# Patient Record
Sex: Female | Born: 1970 | Race: Black or African American | Hispanic: No | Marital: Single | Smoking: Never smoker
Health system: Southern US, Community
[De-identification: ages and names within clinical notes are randomized; demographics above are authoritative.]

## PROBLEM LIST (undated history)

## (undated) DIAGNOSIS — T7840XA Allergy, unspecified, initial encounter: Secondary | ICD-10-CM

## (undated) DIAGNOSIS — F4310 Post-traumatic stress disorder, unspecified: Secondary | ICD-10-CM

## (undated) DIAGNOSIS — J45909 Unspecified asthma, uncomplicated: Secondary | ICD-10-CM

## (undated) DIAGNOSIS — F419 Anxiety disorder, unspecified: Secondary | ICD-10-CM

## (undated) DIAGNOSIS — F32A Depression, unspecified: Secondary | ICD-10-CM

## (undated) HISTORY — DX: Anxiety disorder, unspecified: F41.9

## (undated) HISTORY — DX: Allergy, unspecified, initial encounter: T78.40XA

## (undated) HISTORY — DX: Unspecified asthma, uncomplicated: J45.909

---

## 2001-12-26 ENCOUNTER — Emergency Department (HOSPITAL_COMMUNITY): Admission: EM | Admit: 2001-12-26 | Discharge: 2001-12-26 | Payer: Self-pay | Admitting: Emergency Medicine

## 2001-12-26 ENCOUNTER — Encounter: Payer: Self-pay | Admitting: Emergency Medicine

## 2004-04-03 ENCOUNTER — Other Ambulatory Visit: Admission: RE | Admit: 2004-04-03 | Discharge: 2004-04-03 | Payer: Self-pay | Admitting: Obstetrics and Gynecology

## 2005-05-21 ENCOUNTER — Other Ambulatory Visit: Admission: RE | Admit: 2005-05-21 | Discharge: 2005-05-21 | Payer: Self-pay | Admitting: Obstetrics and Gynecology

## 2005-07-18 ENCOUNTER — Emergency Department (HOSPITAL_COMMUNITY): Admission: EM | Admit: 2005-07-18 | Discharge: 2005-07-19 | Payer: Self-pay | Admitting: Emergency Medicine

## 2005-07-19 ENCOUNTER — Encounter: Admission: RE | Admit: 2005-07-19 | Discharge: 2005-08-22 | Payer: Self-pay | Admitting: Family Medicine

## 2007-03-14 ENCOUNTER — Inpatient Hospital Stay (HOSPITAL_COMMUNITY): Admission: AD | Admit: 2007-03-14 | Discharge: 2007-03-14 | Payer: Self-pay | Admitting: Obstetrics and Gynecology

## 2007-05-28 ENCOUNTER — Inpatient Hospital Stay (HOSPITAL_COMMUNITY): Admission: AD | Admit: 2007-05-28 | Discharge: 2007-05-29 | Payer: Self-pay | Admitting: Pediatrics

## 2007-06-01 ENCOUNTER — Inpatient Hospital Stay (HOSPITAL_COMMUNITY): Admission: AD | Admit: 2007-06-01 | Discharge: 2007-06-04 | Payer: Self-pay | Admitting: Obstetrics and Gynecology

## 2007-06-11 ENCOUNTER — Inpatient Hospital Stay (HOSPITAL_COMMUNITY): Admission: AD | Admit: 2007-06-11 | Discharge: 2007-06-11 | Payer: Self-pay | Admitting: Obstetrics and Gynecology

## 2008-01-25 ENCOUNTER — Emergency Department (HOSPITAL_COMMUNITY): Admission: EM | Admit: 2008-01-25 | Discharge: 2008-01-26 | Payer: Self-pay | Admitting: Emergency Medicine
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
## 2011-05-30 LAB — POCT I-STAT, CHEM 8
BUN: 15
Calcium, Ion: 1.07 — ABNORMAL LOW
Chloride: 105
Creatinine, Ser: 1.1
Glucose, Bld: 92
HCT: 42
Hemoglobin: 14.3
Potassium: 4
Sodium: 138
TCO2: 25

## 2011-05-30 LAB — DIFFERENTIAL
Basophils Absolute: 0
Basophils Relative: 0
Eosinophils Absolute: 0
Eosinophils Relative: 0
Lymphocytes Relative: 6 — ABNORMAL LOW
Lymphs Abs: 0.5 — ABNORMAL LOW
Monocytes Absolute: 0.2
Monocytes Relative: 2 — ABNORMAL LOW
Neutro Abs: 8.6 — ABNORMAL HIGH
Neutrophils Relative %: 92 — ABNORMAL HIGH

## 2011-05-30 LAB — CBC
HCT: 40.6
Hemoglobin: 13.7
MCHC: 33.7
MCV: 84.4
Platelets: 199
RBC: 4.81
RDW: 13.9
WBC: 9.3

## 2011-05-30 LAB — CLOSTRIDIUM DIFFICILE EIA: C difficile Toxins A+B, EIA: NEGATIVE

## 2011-06-14 LAB — URINALYSIS, ROUTINE W REFLEX MICROSCOPIC
Bilirubin Urine: NEGATIVE
Hgb urine dipstick: NEGATIVE
Nitrite: NEGATIVE
Specific Gravity, Urine: 1.025
Urobilinogen, UA: 0.2
pH: 6

## 2011-06-14 LAB — COMPREHENSIVE METABOLIC PANEL
ALT: 24
Albumin: 3.3 — ABNORMAL LOW
Alkaline Phosphatase: 106
Calcium: 9
GFR calc Af Amer: >60
Glucose, Bld: 89
Potassium: 3.9
Sodium: 135
Total Protein: 7.9

## 2011-06-14 LAB — CBC
HCT: 26.9 — ABNORMAL LOW
Hemoglobin: 12.2
Hemoglobin: 9.1 — ABNORMAL LOW
MCHC: 33.5
MCHC: 34.4
MCV: 87.7
Platelets: 297
RBC: 4.12
RDW: 14.3 — ABNORMAL HIGH
WBC: 10.5
WBC: 10.7 — ABNORMAL HIGH

## 2011-06-14 LAB — LACTATE DEHYDROGENASE: LDH: 141

## 2011-06-14 LAB — RPR: RPR Ser Ql: NONREACTIVE

## 2011-06-19 LAB — RH IMMUNE GLOBULIN WORKUP (NOT WOMEN'S HOSP)

## 2013-10-07 ENCOUNTER — Ambulatory Visit (INDEPENDENT_AMBULATORY_CARE_PROVIDER_SITE_OTHER): Payer: BC Managed Care – PPO | Admitting: Physician Assistant

## 2013-10-07 VITALS — BP 140/90 | HR 116 | Temp 101.9°F | Resp 18 | Ht 69.0 in | Wt 340.0 lb

## 2013-10-07 DIAGNOSIS — R509 Fever, unspecified: Secondary | ICD-10-CM

## 2013-10-07 DIAGNOSIS — D72829 Elevated white blood cell count, unspecified: Secondary | ICD-10-CM

## 2013-10-07 DIAGNOSIS — J029 Acute pharyngitis, unspecified: Secondary | ICD-10-CM

## 2013-10-07 LAB — POCT CBC
Granulocyte percent: 83.5 %G — AB (ref 37–80)
HEMATOCRIT: 40.9 % (ref 37.7–47.9)
HEMOGLOBIN: 12.6 g/dL (ref 12.2–16.2)
LYMPH, POC: 1.1 (ref 0.6–3.4)
MCH: 28 pg (ref 27–31.2)
MCHC: 30.8 g/dL — AB (ref 31.8–35.4)
MCV: 90.9 fL (ref 80–97)
MID (cbc): 0.7 (ref 0–0.9)
MPV: 8.9 fL (ref 0–99.8)
POC Granulocyte: 8.9 — AB (ref 2–6.9)
POC LYMPH PERCENT: 10 %L (ref 10–50)
POC MID %: 6.5 %M (ref 0–12)
Platelet Count, POC: 175 10*3/uL (ref 142–424)
RBC: 4.5 M/uL (ref 4.04–5.48)
RDW, POC: 13.2 %
WBC: 10.6 10*3/uL — AB (ref 4.6–10.2)

## 2013-10-07 LAB — POCT INFLUENZA A/B
Influenza A, POC: NEGATIVE
Influenza B, POC: NEGATIVE

## 2013-10-07 LAB — POCT RAPID STREP A (OFFICE): RAPID STREP A SCREEN: NEGATIVE

## 2013-10-07 MED ORDER — AMOXICILLIN 875 MG PO TABS: 875.0000 mg | ORAL_TABLET | Freq: Two times a day (BID) | ORAL | Status: DC

## 2013-10-07 NOTE — Progress Notes (Signed)
Subjective:    Patient ID: Laurie DoeErika Norkus, female    DOB: 17-Aug-1971, 43 y.o.   MRN: [id number]  HPI   Ms. Thad RangerReynolds is a very pleasant 43 yr old female here with concern for illness.  Abrupt onset of symptoms last night.  Symptoms include sore throat, laryngitis, sinus pressure, ear fullness, body aches, fever to 103.76F.  Denies cough, SOB, wheezing.  Endorses nasal and post-nasal drainage.  +sick contacts at work.  No flu shot this season.  Using 1-2 tylenol or advil prn.   Review of Systems  Constitutional: Positive for fever.  HENT: Positive for congestion, ear pain, postnasal drip and rhinorrhea.   Respiratory: Negative for cough, shortness of breath and wheezing.   Cardiovascular: Negative.   Gastrointestinal: Negative.   Musculoskeletal: Positive for arthralgias and myalgias.  Skin: Negative.        Objective:   Physical Exam  Vitals reviewed. Constitutional: She is oriented to person, place, and time. She appears well-developed and well-nourished. No distress.  HENT:  Head: Normocephalic and atraumatic.  Right Ear: Tympanic membrane and ear canal normal.  Left Ear: Tympanic membrane and ear canal normal.  Mouth/Throat: Uvula is midline and mucous membranes are normal. Posterior oropharyngeal erythema present. No oropharyngeal exudate, posterior oropharyngeal edema or tonsillar abscesses.  Eyes: Conjunctivae are normal. No scleral icterus.  Neck: Neck supple.  Cardiovascular: Regular rhythm and normal heart sounds.  Tachycardia present.   Pulmonary/Chest: Effort normal and breath sounds normal. She has no wheezes. She has no rales.  Lymphadenopathy:    She has no cervical adenopathy.  Neurological: She is alert and oriented to person, place, and time.  Skin: Skin is warm and dry.  Psychiatric: She has a normal mood and affect. Her behavior is normal.    Results for orders placed in visit on 10/07/13  POCT INFLUENZA A/B      Result Value Range   Influenza A, POC  Negative     Influenza B, POC Negative    POCT RAPID STREP A (OFFICE)      Result Value Range   Rapid Strep A Screen Negative  Negative  POCT CBC      Result Value Range   WBC 10.6 (*) 4.6 - 10.2 K/uL   Lymph, poc 1.1  0.6 - 3.4   POC LYMPH PERCENT 10.0  10 - 50 %L   MID (cbc) 0.7  0 - 0.9   POC MID % 6.5  0 - 12 %M   POC Granulocyte 8.9 (*) 2 - 6.9   Granulocyte percent 83.5 (*) 37 - 80 %G   RBC 4.50  4.04 - 5.48 M/uL   Hemoglobin 12.6  12.2 - 16.2 g/dL   HCT, POC 40.940.9  81.137.7 - 47.9 %   MCV 90.9  80 - 97 fL   MCH, POC 28.0  27 - 31.2 pg   MCHC 30.8 (*) 31.8 - 35.4 g/dL   RDW, POC 91.413.2     Platelet Count, POC 175  142 - 424 K/uL   MPV 8.9  0 - 99.8 fL        Assessment & Plan:  Fever, unspecified - Plan: POCT Influenza A/B, POCT rapid strep A, Culture, Group A Strep, POCT CBC, amoxicillin (AMOXIL) 875 MG tablet  Sore throat - Plan: POCT Influenza A/B, POCT rapid strep A, Culture, Group A Strep, POCT CBC, amoxicillin (AMOXIL) 875 MG tablet  Leukocytosis, unspecified - Plan: amoxicillin (AMOXIL) 875 MG tablet   Ms. Thad RangerReynolds is a  very pleasant 43 yr old female with abrupt onset of fever, myalgia, ST last night.  Rapid flu and strep tests both negative.  Throat cx pending.  CBC shows leukocytosis with shift suggesting bacterial etiology of symptoms.  Will start amox to cover strep and upper resp pathogens.  Continue Tylenol, Advil for fever, pain.  Mucinex for congestion.   Push fluids, rest.  OOW until fever free for 24 hours without medication.  Pt to call or RTC if worsening or not improving.   Loleta Dicker MHS, PA-C [facility] 2/4/20159:37 AM

## 2013-10-07 NOTE — Patient Instructions (Signed)
Begin taking the amoxicillin today.  This will cover you for upper respiratory infection including strep throat.  Finish the full course of medication even when you begin feeling better  Alternate Tylenol and Advil to keep the fever down, help with body aches and sore throat  Drink plenty of fluids (water is best!) and get plenty of rest  Out of work until fever free for 24 hours without Tylenol or Advil  If any symptoms are worsening or not improving please let us know

## 2013-10-09 LAB — CULTURE, GROUP A STREP: ORGANISM ID, BACTERIA: NORMAL

## 2014-02-23 ENCOUNTER — Ambulatory Visit (INDEPENDENT_AMBULATORY_CARE_PROVIDER_SITE_OTHER): Payer: BC Managed Care – PPO | Admitting: Family Medicine

## 2014-02-23 VITALS — BP 160/100 | HR 90 | Temp 98.8°F | Resp 20 | Ht 68.0 in | Wt 339.4 lb

## 2014-02-23 DIAGNOSIS — H109 Unspecified conjunctivitis: Secondary | ICD-10-CM

## 2014-02-23 DIAGNOSIS — J069 Acute upper respiratory infection, unspecified: Secondary | ICD-10-CM

## 2014-02-23 MED ORDER — TOBRAMYCIN 0.3 % OP SOLN: 1.0000 [drp] | Freq: Four times a day (QID) | OPHTHALMIC | Status: DC

## 2014-02-23 MED ORDER — AZITHROMYCIN 250 MG PO TABS
ORAL_TABLET | ORAL | Status: DC
Start: 2014-02-23 — End: 2016-10-04

## 2014-02-23 NOTE — Progress Notes (Signed)
° °  Subjective:    Patient ID: Laurie Ellison, female    DOB: 06-May-1971, 43 y.o.   MRN: [id number] This chart was scribed for Elvina SidleKurt Lauenstein, MD by Evon Slackerrance Branch, ED Scribe. This Patient was seen in room 03 and the patients care was started at 8:17 PM  HPI HPI Comments: Laurie Doerika Talavera is a 43 y.o. female who presents to the [facility] complaining of eye redness onset 2 days prior. She states she has some associated eye discharge, sore throat, and ear pain. She states her symptoms started after going [place] community pool. She also presents with high blood pressure. She states that its normally not this high. She denies taking any medications other than Zyrtec for her seasonal allergies. 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      Review of Systems  HENT: Positive for ear pain and sore throat.   Eyes: Positive for discharge and redness.     Objective:    Physical Exam Obese woman in no acute distress, seen with daughter HEENT: Normal color TMs, hoarse, right eye injected diffusely with normal fundus, oropharynx mildly erythematous on the left side Neck: Supple no adenopathy, no pretragal nodes Chest: Clear Heart: Regular no murmur Skin: No rash   Assessment & Plan:  Conjunctivitis of right eye - Plan: tobramycin (TOBREX) 0.3 % ophthalmic solution  URI (upper respiratory infection) - Plan: azithromycin (ZITHROMAX Z-PAK) 250 MG tablet  Signed, Elvina SidleKurt Lauenstein, MD

## 2014-02-23 NOTE — Patient Instructions (Signed)

## 2015-11-28 ENCOUNTER — Encounter (HOSPITAL_COMMUNITY): Payer: Self-pay | Admitting: Emergency Medicine

## 2015-11-28 ENCOUNTER — Emergency Department (HOSPITAL_COMMUNITY)
Admission: EM | Admit: 2015-11-28 | Discharge: 2015-11-28 | Disposition: A | Discharge status: Home / Self Care | Payer: BC Managed Care – PPO | Attending: Emergency Medicine | Admitting: Emergency Medicine

## 2015-11-28 ENCOUNTER — Emergency Department (HOSPITAL_COMMUNITY): Payer: BC Managed Care – PPO

## 2015-11-28 DIAGNOSIS — H748X3 Other specified disorders of middle ear and mastoid, bilateral: Secondary | ICD-10-CM | POA: Diagnosis not present

## 2015-11-28 DIAGNOSIS — Z792 Long term (current) use of antibiotics: Secondary | ICD-10-CM | POA: Insufficient documentation

## 2015-11-28 DIAGNOSIS — J209 Acute bronchitis, unspecified: Secondary | ICD-10-CM

## 2015-11-28 DIAGNOSIS — J45901 Unspecified asthma with (acute) exacerbation: Secondary | ICD-10-CM | POA: Insufficient documentation

## 2015-11-28 DIAGNOSIS — R509 Fever, unspecified: Secondary | ICD-10-CM | POA: Diagnosis present

## 2015-11-28 MED ORDER — FLUTICASONE PROPIONATE 50 MCG/ACT NA SUSP: 2.0000 | Freq: Every day | NASAL | Status: DC

## 2015-11-28 MED ORDER — BENZONATATE 100 MG PO CAPS: 100.0000 mg | ORAL_CAPSULE | Freq: Three times a day (TID) | ORAL | Status: DC | PRN

## 2015-11-28 MED ORDER — CETIRIZINE HCL 10 MG PO TABS: 10.0000 mg | ORAL_TABLET | Freq: Every day | ORAL | Status: AC

## 2015-11-28 MED ORDER — NAPROXEN 250 MG PO TABS: 250.0000 mg | ORAL_TABLET | Freq: Two times a day (BID) | ORAL | Status: DC

## 2015-11-28 MED ORDER — ALBUTEROL SULFATE HFA 108 (90 BASE) MCG/ACT IN AERS
2.0000 | INHALATION_SPRAY | Freq: Once | RESPIRATORY_TRACT | Status: AC
  Administered 2015-11-28: 2 via RESPIRATORY_TRACT
  Filled 2015-11-28: qty 6.7

## 2015-11-28 NOTE — ED Notes (Signed)
Pt sts fever and body aches with cough and nasal congestion x 3 days; pt sts worse today and noted hypertension at [facility] so sent here for eval

## 2015-11-28 NOTE — ED Provider Notes (Signed)
CSN: [id number]     Arrival date & time 11/28/15  1131 History  By signing my name below, I, Tanda Rockers, attest that this documentation has been prepared under the direction and in the presence of Everlene Farrier, PA-C. Electronically Signed: Tanda Rockers, ED Scribe. 11/28/2015. 1:10 PM.   Chief Complaint  Patient presents with  . Fever  . Cough   The history is provided by the patient. No language interpreter was used.     HPI Comments: Laurie Ellison is a 45 y.o. female with PMHx asthma who presents to the Emergency Department complaining of gradual onset, constant, cough and wheezing x 3 days. Pt also complains of congestion, rhinorrhea, post nasal drip, fever, and body aches. Pt did take 3 Tylenol earlier today for her symptoms with some relief. She did not receive her flu vaccine this year. She was seen at [facility] today for her symptoms. She was tested for the flu which was negative but was sent here for hypertension. Her BP in the ED is 153/104. Pt does not have a hx of HTN. Denies shortness of breath, nausea, vomiting, diarrhea, ear pain, or any other associated symptoms.    PCP - Dr. Wynelle Link  Past Medical History  Diagnosis Date  . Allergy   . Asthma    History reviewed. No pertinent past surgical history. Family History  Problem Relation Age of Onset  . Hypertension Mother   . Hypertension Father    Social History  Substance Use Topics  . Smoking status: Never Smoker   . Smokeless tobacco: Never Used  . Alcohol Use: No   OB History    No data available     Review of Systems  Constitutional: Positive for fever.  HENT: Positive for congestion, postnasal drip and rhinorrhea. Negative for sore throat.   Eyes: Negative for visual disturbance.  Respiratory: Positive for cough and wheezing. Negative for chest tightness and shortness of breath.   Cardiovascular: Negative for chest pain and palpitations.  Gastrointestinal: Negative for nausea, vomiting,  abdominal pain and diarrhea.  Genitourinary: Negative for dysuria.  Musculoskeletal: Positive for myalgias. Negative for back pain and neck pain.  Skin: Negative for rash.  Neurological: Negative for syncope, light-headedness and headaches.      Allergies  Review of patient's allergies indicates no known allergies.  Home Medications   Prior to Admission medications   Medication Sig Start Date End Date Taking? Authorizing Provider  azithromycin (ZITHROMAX Z-PAK) 250 MG tablet Take as directed on pack 02/23/14   Elvina Sidle, MD  benzonatate (TESSALON) 100 MG capsule Take 1 capsule (100 mg total) by mouth 3 (three) times daily as needed for cough. 11/28/15   Everlene Farrier, PA-C  cetirizine (ZYRTEC ALLERGY) 10 MG tablet Take 1 tablet (10 mg total) by mouth daily. 11/28/15   Everlene Farrier, PA-C  fluticasone (FLONASE) 50 MCG/ACT nasal spray Place 2 sprays into both nostrils daily. 11/28/15   Everlene Farrier, PA-C  naproxen (NAPROSYN) 250 MG tablet Take 1 tablet (250 mg total) by mouth 2 (two) times daily with a meal. 11/28/15   Everlene Farrier, PA-C  tobramycin (TOBREX) 0.3 % ophthalmic solution Place 1 drop into the right eye every 6 (six) hours. 02/23/14   Elvina Sidle, MD   BP 153/104 mmHg  Pulse 99  Temp(Src) 100.3 F (37.9 C) (Oral)  Resp 18  SpO2 96%  LMP 11/27/2015   Physical Exam  Constitutional: She is oriented to person, place, and time. She appears well-developed and well-nourished.  No distress.  Nontoxic appearing.  HENT:  Head: Normocephalic and atraumatic.  Right Ear: External ear normal. A middle ear effusion is present.  Left Ear: External ear normal. A middle ear effusion is present.  No TM erythema or loss of landmarks bilaterally.   Eyes: Conjunctivae are normal. Pupils are equal, round, and reactive to light. Right eye exhibits no discharge. Left eye exhibits no discharge.  Neck: Normal range of motion. Neck supple.  Cardiovascular: Normal rate, regular rhythm,  normal heart sounds and intact distal pulses.   Heart rate is 76 bpm.   Pulmonary/Chest: Effort normal. No respiratory distress. She has wheezes. She has no rhonchi. She has no rales.  Slight scattered wheeze to bilateral bases. No increased work of breathing.  No rales or rhonchi.   Abdominal: Soft. There is no tenderness. There is no guarding.  Musculoskeletal: She exhibits no edema.  Lymphadenopathy:    She has no cervical adenopathy.  Neurological: She is alert and oriented to person, place, and time. No cranial nerve deficit. Coordination normal.  Skin: Skin is warm and dry. No rash noted. She is not diaphoretic. No erythema. No pallor.  Psychiatric: She has a normal mood and affect. Her behavior is normal.  Nursing note and vitals reviewed.   ED Course  Procedures (including critical care time)  DIAGNOSTIC STUDIES: Oxygen Saturation is 96% on RA, normal by my interpretation.    COORDINATION OF CARE: 1:07 PM-Discussed treatment plan which includes Rx tessalon pearls, nasal steroids, and Naprosyn with pt at bedside and pt agreed to plan.   Labs Review Labs Reviewed - No data to display  Imaging Review Dg Chest 2 View  11/28/2015  CLINICAL DATA:  Productive cough and mid chest pain.  Fever. EXAM: CHEST  2 VIEW COMPARISON:  None. FINDINGS: There is slight peribronchial thickening. The lungs are otherwise clear. Heart size and vascularity are normal. No effusions. Bones are normal. IMPRESSION: Slight bronchitic changes. Electronically Signed   By: Francene BoyersJames  Maxwell M.D.   On: 11/28/2015 12:47   I have personally reviewed and evaluated these images as part of my medical decision-making.   MDM   Meds given in ED:  Medications  albuterol (PROVENTIL HFA;VENTOLIN HFA) 108 (90 Base) MCG/ACT inhaler 2 puff (not administered)    New Prescriptions   BENZONATATE (TESSALON) 100 MG CAPSULE    Take 1 capsule (100 mg total) by mouth 3 (three) times daily as needed for cough.   CETIRIZINE  (ZYRTEC ALLERGY) 10 MG TABLET    Take 1 tablet (10 mg total) by mouth daily.   FLUTICASONE (FLONASE) 50 MCG/ACT NASAL SPRAY    Place 2 sprays into both nostrils daily.   NAPROXEN (NAPROSYN) 250 MG TABLET    Take 1 tablet (250 mg total) by mouth 2 (two) times daily with a meal.    Final diagnoses:  Acute bronchitis, unspecified organism   This is a 45 y.o. female with PMHx asthma who presents to the Emergency Department complaining of gradual onset, constant, cough and wheezing x 3 days. Pt also complains of congestion, rhinorrhea, post nasal drip, fever, and body aches. Pt did take 3 Tylenol earlier today for her symptoms with some relief. She did not receive her flu vaccine this year. [facility] her [facility]. She was tested for the flu which was negative but was sent here for hypertension. Her BP in the ED is 153/104. Pt does not have a hx of HTN. Denies shortness  of breath.  .On exam the patient has a temperature of 100.3. She is nontoxic-appearing. She has slight scattered wheezes noted bilaterally. No increased work of breathing. Blood pressure is 153/104. She denies headache. Chest x-ray shows slight bronchitic changes. Patient with acute bronchitis. Will provide with albuterol inhaler in the emergency department and discharged with prescriptions for naproxen, Flonase, Zyrtec and Tessalon Perles. I encouraged her to follow closely with her primary care provider to have her blood pressure rechecked and possibly be started on blood pressure medications. I advised the patient to follow-up with their primary care provider this week. I advised the patient to return to the emergency department with new or worsening symptoms or new concerns. The patient verbalized understanding and agreement with plan.    I personally performed the services described in this documentation, which was scribed in my presence. The recorded information has been reviewed and is accurate.         Everlene Farrier, PA-C 11/28/15 1321  Arby Barrette, MD 11/30/15 1029

## 2015-11-28 NOTE — ED Notes (Signed)
Declined W/C at D/C and was escorted to lobby by RN. 

## 2015-11-28 NOTE — Discharge Instructions (Signed)

## 2016-03-09 ENCOUNTER — Other Ambulatory Visit: Payer: Self-pay | Admitting: Family Medicine

## 2016-03-09 ENCOUNTER — Other Ambulatory Visit (HOSPITAL_COMMUNITY)
Admission: RE | Admit: 2016-03-09 | Discharge: 2016-03-09 | Disposition: A | Discharge status: Home / Self Care | Payer: BC Managed Care – PPO | Source: Ambulatory Visit | Attending: Family Medicine | Admitting: Family Medicine

## 2016-03-09 DIAGNOSIS — Z113 Encounter for screening for infections with a predominantly sexual mode of transmission: Secondary | ICD-10-CM | POA: Insufficient documentation

## 2016-03-09 DIAGNOSIS — Z01419 Encounter for gynecological examination (general) (routine) without abnormal findings: Secondary | ICD-10-CM | POA: Insufficient documentation

## 2016-03-12 LAB — CYTOLOGY - PAP

## 2016-10-04 ENCOUNTER — Ambulatory Visit (INDEPENDENT_AMBULATORY_CARE_PROVIDER_SITE_OTHER): Payer: BC Managed Care – PPO | Admitting: Emergency Medicine

## 2016-10-04 VITALS — BP 164/86 | HR 70 | Temp 98.5°F | Resp 20 | Ht 69.0 in | Wt 347.0 lb

## 2016-10-04 DIAGNOSIS — R05 Cough: Secondary | ICD-10-CM | POA: Diagnosis not present

## 2016-10-04 DIAGNOSIS — J4520 Mild intermittent asthma, uncomplicated: Secondary | ICD-10-CM | POA: Diagnosis not present

## 2016-10-04 DIAGNOSIS — R059 Cough, unspecified: Secondary | ICD-10-CM

## 2016-10-04 DIAGNOSIS — J209 Acute bronchitis, unspecified: Secondary | ICD-10-CM

## 2016-10-04 MED ORDER — BENZONATATE 200 MG PO CAPS: 200.0000 mg | ORAL_CAPSULE | Freq: Two times a day (BID) | ORAL | 0 refills | Status: DC | PRN

## 2016-10-04 MED ORDER — PROMETHAZINE-CODEINE 6.25-10 MG/5ML PO SYRP: 5.0000 mL | ORAL_SOLUTION | Freq: Every evening | ORAL | 0 refills | Status: AC | PRN

## 2016-10-04 MED ORDER — AMOXICILLIN-POT CLAVULANATE 875-125 MG PO TABS: 1.0000 | ORAL_TABLET | Freq: Two times a day (BID) | ORAL | 0 refills | Status: DC

## 2016-10-04 MED ORDER — PREDNISONE 20 MG PO TABS: 40.0000 mg | ORAL_TABLET | Freq: Every day | ORAL | 0 refills | Status: AC

## 2016-10-04 NOTE — Patient Instructions (Addendum)
     IF you received an x-ray today, you will receive an invoice from [facility]. Please contact [facility] at [phone] with questions or concerns regarding your invoice.   IF you received labwork today, you will receive an invoice from [facility]. Please contact [facility] at [phone] with questions or concerns regarding your invoice.   Our billing staff will not be able to assist you with questions regarding bills from these companies.  You will be contacted with the lab results as soon as they are available. The fastest way to get your results is to activate your My Chart account. Instructions are located on the last page of this paperwork. If you have not heard from us regarding the results in 2 weeks, please contact this office.      Acute Bronchitis, Adult Acute bronchitis is when air tubes (bronchi) in the lungs suddenly get swollen. The condition can make it hard to breathe. It can also cause these symptoms:  A cough.  Coughing up clear, yellow, or green mucus.  Wheezing.  Chest congestion.  Shortness of breath.  A fever.  Body aches.  Chills.  A sore throat.  Follow these instructions at home: Medicines  Take over-the-counter and prescription medicines only as told by your doctor.  If you were prescribed an antibiotic medicine, take it as told by your doctor. Do not stop taking the antibiotic even if you start to feel better. General instructions  Rest.  Drink enough fluids to keep your pee (urine) clear or pale yellow.  Avoid smoking and secondhand smoke. If you smoke and you need help quitting, ask your doctor. Quitting will help your lungs heal faster.  Use an inhaler, cool mist vaporizer, or humidifier as told by your doctor.  Keep all follow-up visits as told by your doctor. This is important. How is this prevented? To lower your risk of getting this condition again:  Wash your hands often with soap and water. If you cannot  use soap and water, use hand sanitizer.  Avoid contact with people who have cold symptoms.  Try not to touch your hands to your mouth, nose, or eyes.  Make sure to get the flu shot every year.  Contact a doctor if:  Your symptoms do not get better in 2 weeks. Get help right away if:  You cough up blood.  You have chest pain.  You have very bad shortness of breath.  You become dehydrated.  You faint (pass out) or keep feeling like you are going to pass out.  You keep throwing up (vomiting).  You have a very bad headache.  Your fever or chills gets worse. This information is not intended to replace advice given to you by your health care provider. Make sure you discuss any questions you have with your health care provider. Document Released: 02/06/2008 Document Revised: 03/28/2016 Document Reviewed: 02/08/2016 Elsevier Interactive Patient Education  2017 [employer]  

## 2016-10-04 NOTE — Progress Notes (Signed)
Laurie Ellison 47 y.o.   Chief Complaint  Patient presents with  . Cough    Productive, Chest congestion  . Nasal Congestion  . Sinusitis  . Sore Throat    HISTORY OF PRESENT ILLNESS: This is a 46 y.o. female complaining of productive cough x almost a week. Co-workers also sick.  Cough  This is a new problem. The current episode started in the past 7 days. The problem has been gradually worsening. The problem occurs constantly. The cough is productive of sputum. Associated symptoms include chills, headaches, myalgias, nasal congestion, rhinorrhea, a sore throat and wheezing. Pertinent negatives include no chest pain, ear pain, Laurie redness, fever, hemoptysis, rash or shortness of breath. She has tried OTC cough suppressant for the symptoms. Her past medical history is significant for asthma.     Prior to Admission medications   Medication Sig Start Date End Date Taking? Authorizing Provider  albuterol (PROVENTIL HFA;VENTOLIN HFA) 108 (90 Base) MCG/ACT inhaler Inhale into the lungs every 6 (six) hours as needed for wheezing or shortness of breath.   Yes Historical Provider, MD  cetirizine (ZYRTEC ALLERGY) 10 MG tablet Take 1 tablet (10 mg total) by mouth daily. 11/28/15  Yes Everlene Farrier, PA-C    Allergies  Allergen Reactions  . Aspirin     There are no active problems to display for this patient.   Past Medical History:  Diagnosis Date  . Allergy   . Anxiety   . Asthma     No past surgical history on file.  Social History   Social History  . Marital status: Single    Spouse name: N/A  . Number of children: N/A  . Years of education: N/A   Occupational History  . Not on file.   Social History Main Topics  . Smoking status: Never Smoker  . Smokeless tobacco: Never Used  . Alcohol use No  . Drug use: No  . Sexual activity: Not on file   Other Topics Concern  . Not on file   Social History Narrative  . No narrative on file    Family History  Problem  Relation Age of Onset  . Hypertension Mother   . Hypertension Father   . Hyperlipidemia Paternal Uncle   . Heart disease Paternal Uncle   . Stroke Paternal Uncle      Review of Systems  Constitutional: Positive for chills and malaise/fatigue. Negative for fever.  HENT: Positive for congestion, rhinorrhea, sinus pain and sore throat. Negative for ear pain and nosebleeds.   Eyes: Negative for discharge and redness.  Respiratory: Positive for cough, sputum production and wheezing. Negative for hemoptysis and shortness of breath.   Cardiovascular: Negative for chest pain, palpitations and leg swelling.  Gastrointestinal: Positive for nausea. Negative for abdominal pain, diarrhea and vomiting.  Genitourinary: Negative for dysuria and hematuria.  Musculoskeletal: Positive for myalgias.  Skin: Negative for rash.  Neurological: Positive for headaches. Negative for dizziness, sensory change, speech change and focal weakness.  Endo/Heme/Allergies: Does not bruise/bleed easily.  All other systems reviewed and are negative.  Vitals:   10/04/16 1530  BP: (!) 164/86  Pulse: 70  Resp: 20  Temp: 98.5 F (36.9 C)     Physical Exam  Constitutional: She is oriented to person, place, and time. She appears well-developed.  Obese.  HENT:  Head: Normocephalic and atraumatic.  Nose: Nose normal.  Mouth/Throat: Oropharynx is clear and moist.  Eyes: Conjunctivae and EOM are normal. Pupils are equal, round, and reactive  to light.  Neck: Normal range of motion. Neck supple. No JVD present. No thyromegaly present.  Cardiovascular: Normal rate, regular rhythm and normal heart sounds.   No murmur heard. Pulmonary/Chest: Effort normal. She has wheezes (mild, scant, diffuse). She has no rales.  Abdominal: Soft. She exhibits no distension. There is no tenderness.  Musculoskeletal: Normal range of motion.  Lymphadenopathy:    She has no cervical adenopathy.  Neurological: She is alert and oriented to  person, place, and time. No sensory deficit. She exhibits normal muscle tone.  Skin: Skin is warm and dry. Capillary refill takes less than 2 seconds.  Psychiatric: She has a normal mood and affect. Her behavior is normal.  Vitals reviewed.    ASSESSMENT & PLAN: Laurie Ellison was seen today for cough, nasal congestion, sinusitis and sore throat.  Diagnoses and all orders for this visit:  Acute bronchitis, unspecified organism  Cough  Mild intermittent asthma without complication  Other orders -     amoxicillin-clavulanate (AUGMENTIN) 875-125 MG tablet; Take 1 tablet by mouth 2 (two) times daily. -     predniSONE (DELTASONE) 20 MG tablet; Take 2 tablets (40 mg total) by mouth daily with breakfast. -     promethazine-codeine (PHENERGAN WITH CODEINE) 6.25-10 MG/5ML syrup; Take 5 mLs by mouth at bedtime as needed for cough. -     benzonatate (TESSALON) 200 MG capsule; Take 1 capsule (200 mg total) by mouth 2 (two) times daily as needed for cough.    Patient Instructions       IF you received an x-ray today, you will receive an invoice from [facility]. Please contact [facility] at [phone] with questions or concerns regarding your invoice.   IF you received labwork today, you will receive an invoice from Laurie Ellison. Please contact [facility]ie Ellison at [phone] with questions or concerns regarding your invoice.   Our billing staff will not be able to assist you with questions regarding bills from these companies.  You will be contacted with the lab results as soon as they are available. The fastest way to get your results is to activate your My Chart account. Instructions are located on the last page of this paperwork. If you have not heard from us regarding the results in 2 weeks, please contact this office.     Acute Bronchitis, Adult Acute bronchitis is when air tubes (bronchi) in the lungs suddenly get swollen. The condition can make it hard to breathe. It can also  cause these symptoms:  A cough.  Coughing up clear, yellow, or green mucus.  Wheezing.  Chest congestion.  Shortness of breath.  A fever.  Body aches.  Chills.  A sore throat. Follow these instructions at home: Medicines  Take over-the-counter and prescription medicines only as told by your doctor.  If you were prescribed an antibiotic medicine, take it as told by your doctor. Do not stop taking the antibiotic even if you start to feel better. General instructions  Rest.  Drink enough fluids to keep your pee (urine) clear or pale yellow.  Avoid smoking and secondhand smoke. If you smoke and you need help quitting, ask your doctor. Quitting will help your lungs heal faster.  Use an inhaler, cool mist vaporizer, or humidifier as told by your doctor.  Keep all follow-up visits as told by your doctor. This is important. How is this prevented? To lower your risk of getting this condition again:  Wash your hands often with soap and water. If you cannot use soap  and water, use hand sanitizer.  Avoid contact with people who have cold symptoms.  Try not to touch your hands to your mouth, nose, or eyes.  Make sure to get the flu shot every year. Contact a doctor if:  Your symptoms do not get better in 2 weeks. Get help right away if:  You cough up blood.  You have chest pain.  You have very bad shortness of breath.  You become dehydrated.  You faint (pass out) or keep feeling like you are going to pass out.  You keep throwing up (vomiting).  You have a very bad headache.  Your fever or chills gets worse. This information is not intended to replace advice given to you by your health care provider. Make sure you discuss any questions you have with your health care provider. Document Released: 02/06/2008 Document Revised: 03/28/2016 Document Reviewed: 02/08/2016 Elsevier Interactive Patient Education  2017 Elsevier Inc.       Edwina Barth, MD [facility]

## 2017-01-01 ENCOUNTER — Ambulatory Visit: Payer: BC Managed Care – PPO | Admitting: Family Medicine

## 2017-09-27 IMAGING — DX DG CHEST 2V
2 series · 2 of 2 positions shown · non-contrast
Comparison: None.

CLINICAL DATA: Productive cough and mid chest pain.  Fever.

EXAM:
CHEST  2 VIEW

[w chest pa]
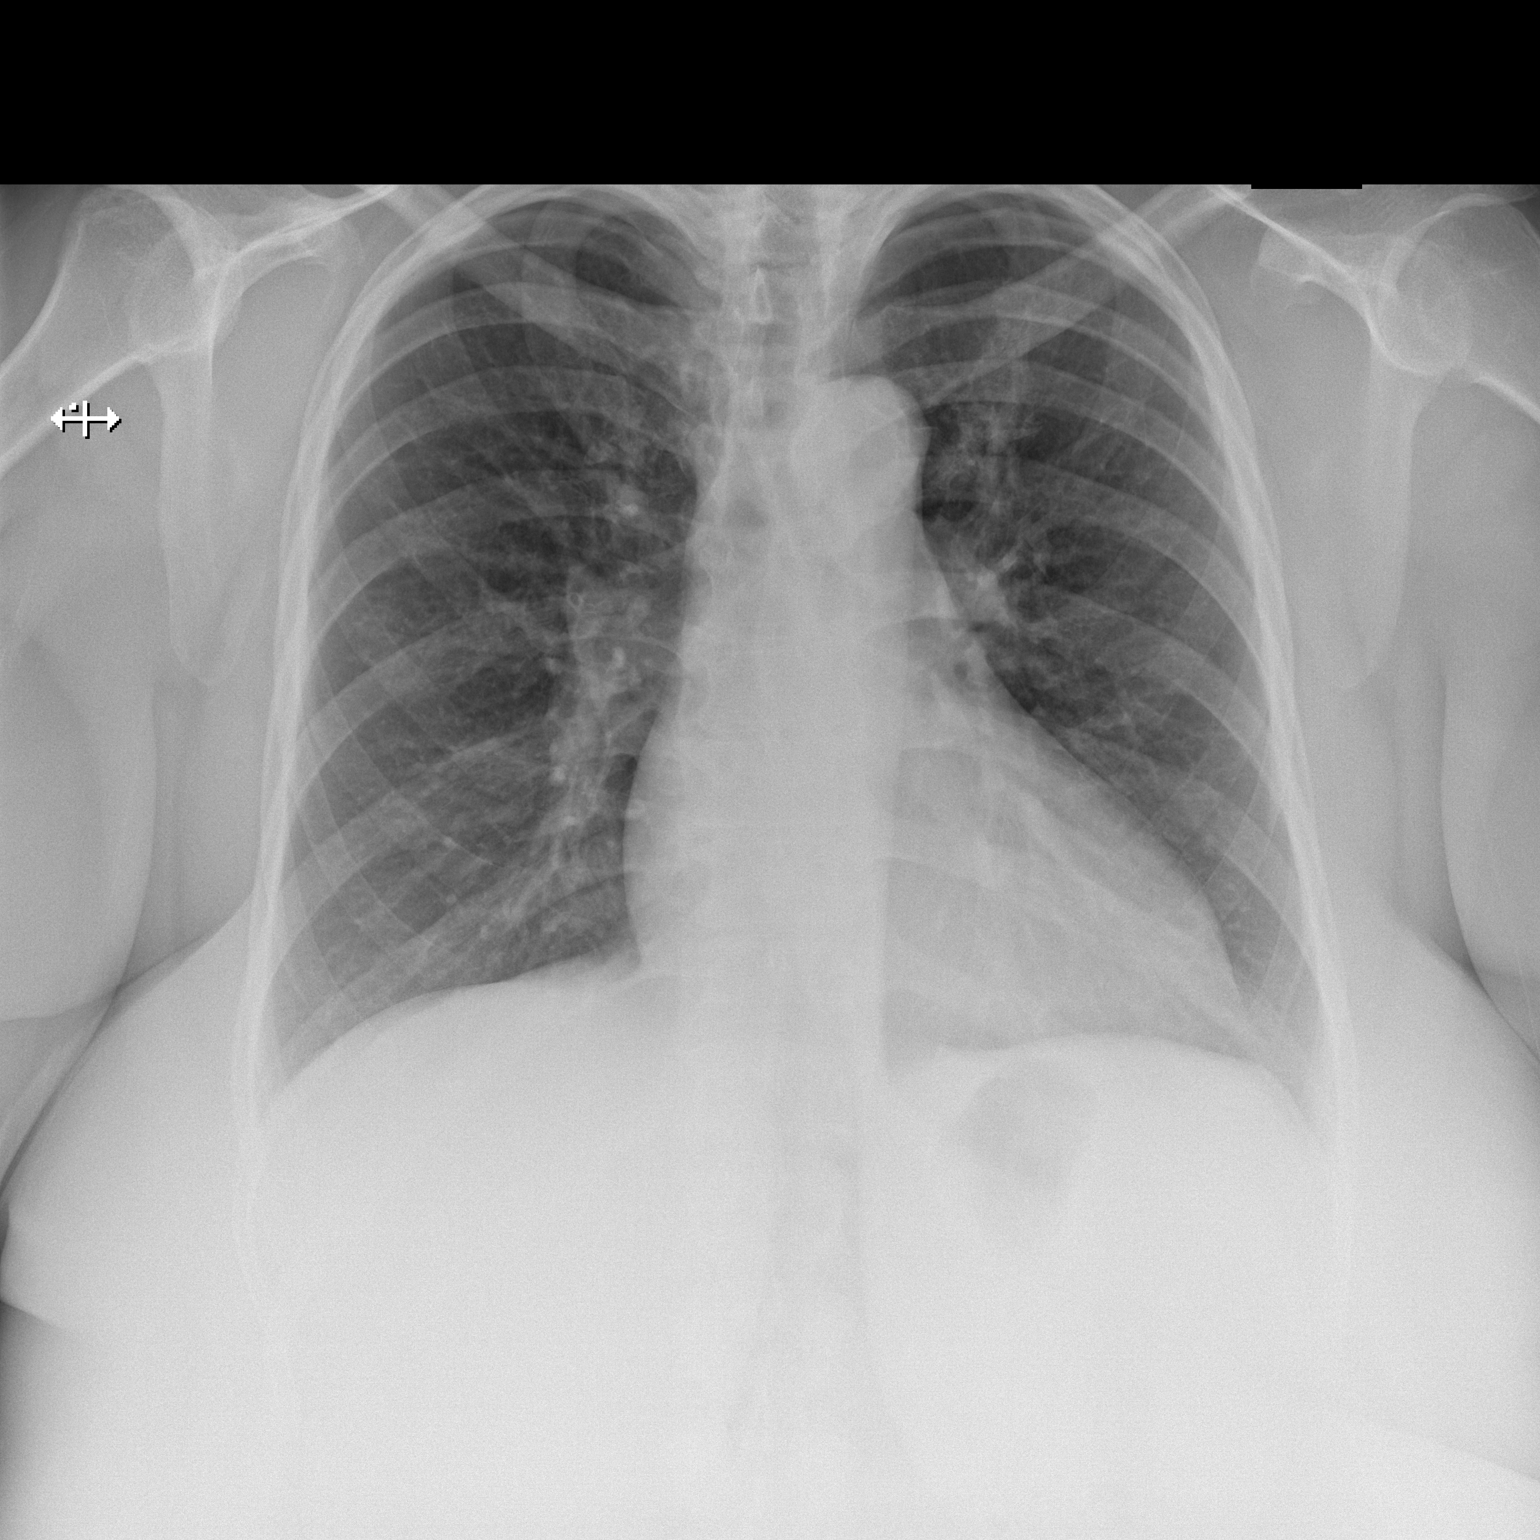

[w chest lat]
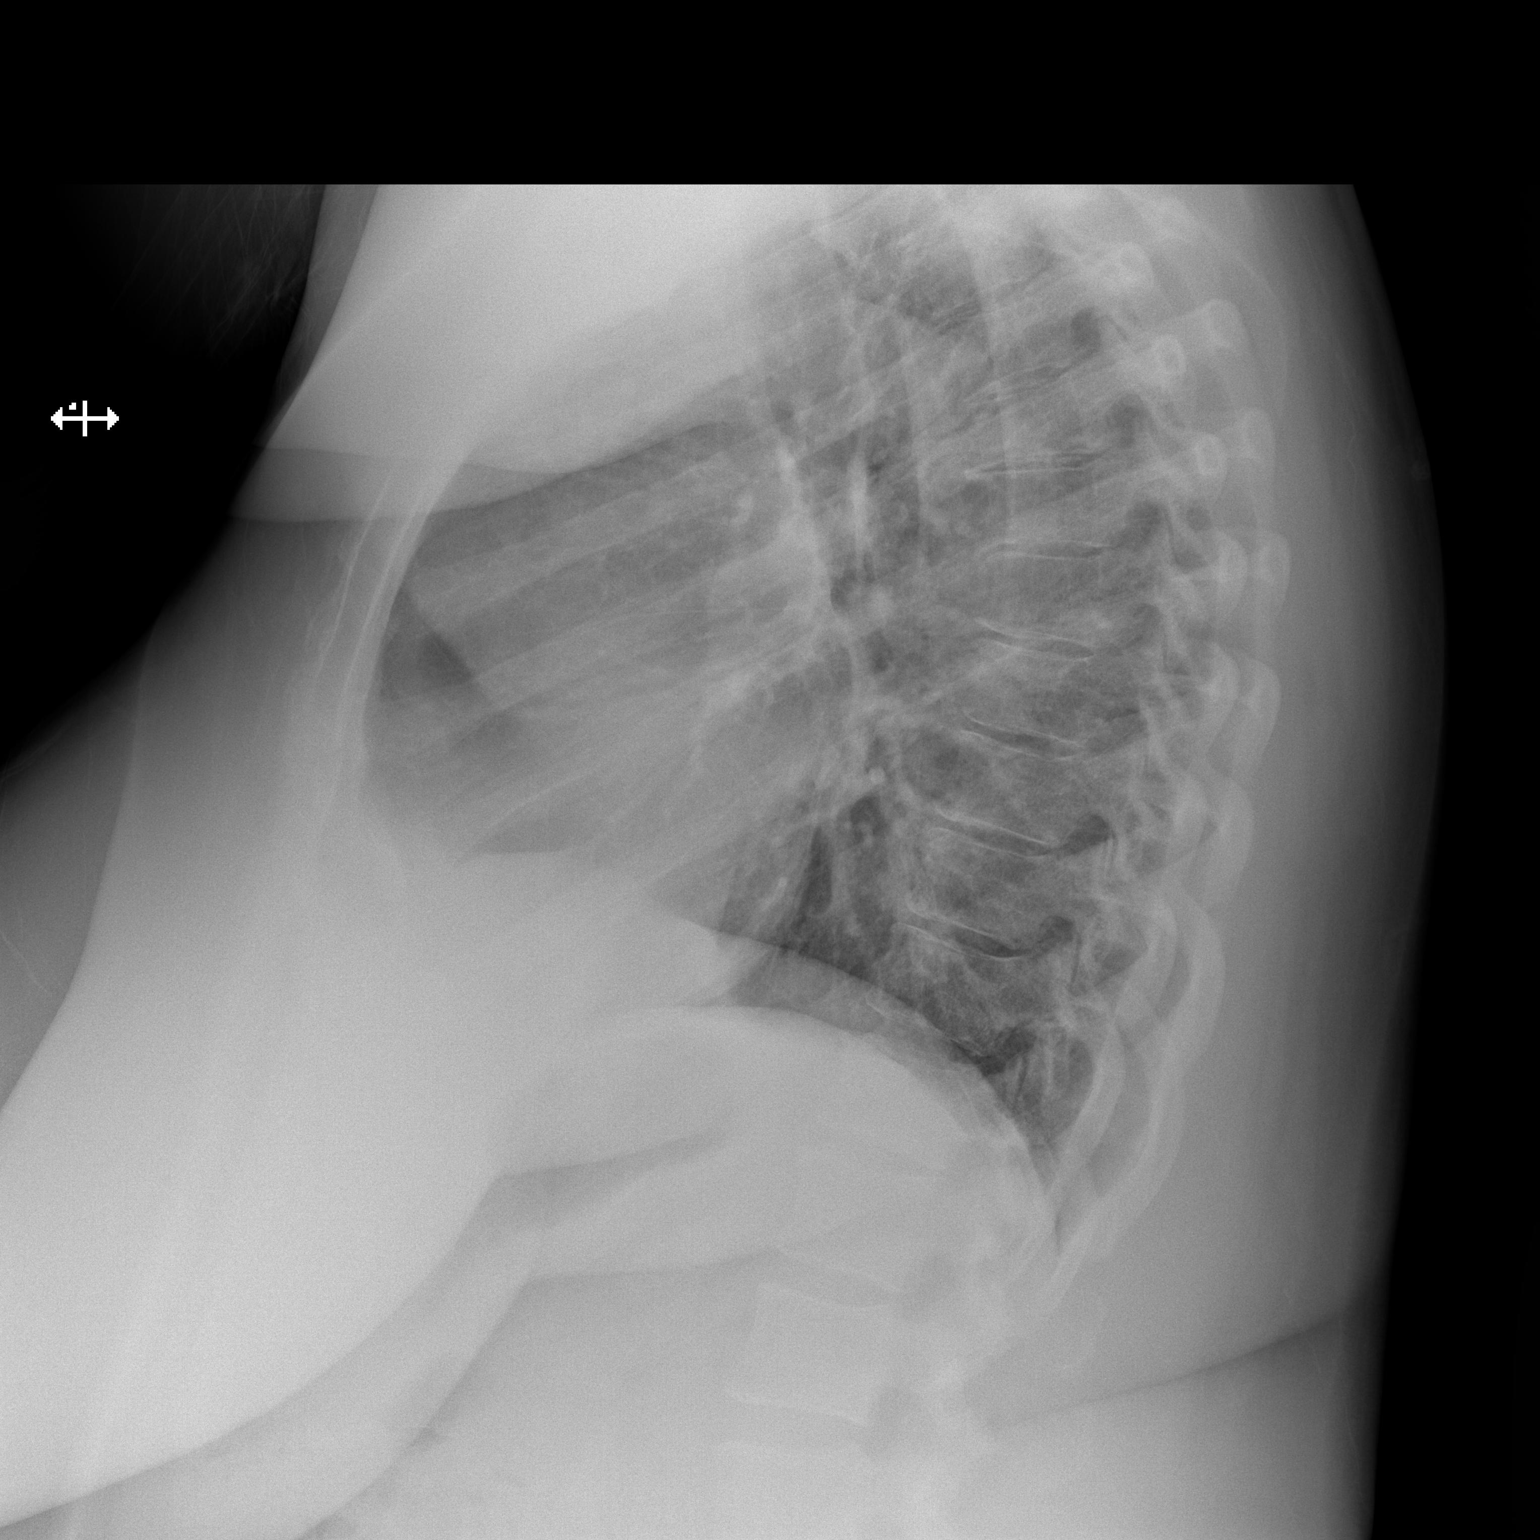

[2 of 2 positions shown; findings below may reference images not displayed]

FINDINGS: There is slight peribronchial thickening. The lungs are otherwise
clear. Heart size and vascularity are normal. No effusions. Bones
are normal.
IMPRESSION: Slight bronchitic changes.

## 2020-06-07 ENCOUNTER — Other Ambulatory Visit: Payer: Self-pay

## 2020-06-07 ENCOUNTER — Ambulatory Visit (HOSPITAL_COMMUNITY)
Admission: EM | Admit: 2020-06-07 | Discharge: 2020-06-07 | Disposition: A | Discharge status: Home / Self Care | Payer: PRIVATE HEALTH INSURANCE | Attending: Family Medicine | Admitting: Family Medicine

## 2020-06-07 ENCOUNTER — Encounter (HOSPITAL_COMMUNITY): Payer: Self-pay

## 2020-06-07 DIAGNOSIS — Z20822 Contact with and (suspected) exposure to covid-19: Secondary | ICD-10-CM | POA: Diagnosis not present

## 2020-06-07 DIAGNOSIS — J0110 Acute frontal sinusitis, unspecified: Secondary | ICD-10-CM | POA: Insufficient documentation

## 2020-06-07 DIAGNOSIS — R509 Fever, unspecified: Secondary | ICD-10-CM | POA: Insufficient documentation

## 2020-06-07 DIAGNOSIS — R030 Elevated blood-pressure reading, without diagnosis of hypertension: Secondary | ICD-10-CM | POA: Diagnosis not present

## 2020-06-07 DIAGNOSIS — I776 Arteritis, unspecified: Secondary | ICD-10-CM | POA: Diagnosis present

## 2020-06-07 LAB — COMPREHENSIVE METABOLIC PANEL
ALT: 36 U/L (ref 0–44)
AST: 33 U/L (ref 15–41)
Albumin: 3.6 g/dL (ref 3.5–5.0)
Alkaline Phosphatase: 62 U/L (ref 38–126)
Anion gap: 12 (ref 5–15)
BUN: 11 mg/dL (ref 6–20)
CO2: 23 mmol/L (ref 22–32)
Calcium: 9 mg/dL (ref 8.9–10.3)
Chloride: 101 mmol/L (ref 98–111)
Creatinine, Ser: 0.99 mg/dL (ref 0.44–1.00)
GFR calc non Af Amer: >60 mL/min (ref >60)
Glucose, Bld: 91 mg/dL (ref 70–99)
Potassium: 3.8 mmol/L (ref 3.5–5.1)
Sodium: 136 mmol/L (ref 135–145)
Total Bilirubin: 0.8 mg/dL (ref 0.3–1.2)
Total Protein: 7.9 g/dL (ref 6.5–8.1)

## 2020-06-07 LAB — CBC WITH DIFFERENTIAL/PLATELET
Abs Immature Granulocytes: 0.02 10*3/uL (ref 0.00–0.07)
Basophils Absolute: 0 10*3/uL (ref 0.0–0.1)
Basophils Relative: 0 %
Eosinophils Absolute: 0.1 10*3/uL (ref 0.0–0.5)
Eosinophils Relative: 2 %
HCT: 41.1 % (ref 36.0–46.0)
Hemoglobin: 13 g/dL (ref 12.0–15.0)
Immature Granulocytes: 0 %
Lymphocytes Relative: 28 %
Lymphs Abs: 1.3 10*3/uL (ref 0.7–4.0)
MCH: 28.8 pg (ref 26.0–34.0)
MCHC: 31.6 g/dL (ref 30.0–36.0)
MCV: 91.1 fL (ref 80.0–100.0)
Monocytes Absolute: 0.4 10*3/uL (ref 0.1–1.0)
Monocytes Relative: 8 %
Neutro Abs: 2.9 10*3/uL (ref 1.7–7.7)
Neutrophils Relative %: 62 %
Platelets: 160 10*3/uL (ref 150–400)
RBC: 4.51 MIL/uL (ref 3.87–5.11)
RDW: 12.7 % (ref 11.5–15.5)
WBC: 4.7 10*3/uL (ref 4.0–10.5)
nRBC: 0 % (ref 0.0–0.2)

## 2020-06-07 LAB — C-REACTIVE PROTEIN: CRP: 1.9 mg/dL — ABNORMAL HIGH (ref <1.0)

## 2020-06-07 MED ORDER — AMOXICILLIN-POT CLAVULANATE 875-125 MG PO TABS: 1.0000 | ORAL_TABLET | Freq: Two times a day (BID) | ORAL | 0 refills | Status: DC

## 2020-06-07 NOTE — ED Triage Notes (Signed)
Patient in with c/o of sinus issues, congestion that started Thursday. Fever of 100.1 last night. Also c/o bilateral leg/foot burning, tenderness, with red rash that was noticed today  patient did not take any medication for sx  Denies any sob, n/v, diarrhea, cp, runny nose

## 2020-06-07 NOTE — Discharge Instructions (Addendum)
Your blood pressure was noted to be elevated during your visit today. If you are currently taking medication for high blood pressure, please ensure you are taking this as directed. If you do not have a history of high blood pressure and your blood pressure remains persistently elevated, you may need to begin taking a medication at some point. You may return here within the next few days to recheck if unable to see your primary care provider or if do not have a one.  BP (!) 189/106 (BP Location: Right Arm)   Pulse 76   Temp 98.8 F (37.1 C) (Oral)   LMP 05/30/2020 (Approximate)   SpO2 100%    You have been tested for COVID-19 today. If your test returns positive, you will receive a phone call from [facility] regarding your results. Negative test results are not called. Both positive and negative results area always visible on MyChart. If you do not have a MyChart account, sign up instructions are provided in your discharge papers. Please do not hesitate to contact us should you have questions or concerns.

## 2020-06-08 LAB — SARS CORONAVIRUS 2 (TAT 6-24 HRS): SARS Coronavirus 2: NEGATIVE

## 2020-06-08 NOTE — ED Provider Notes (Signed)
[facility] CARE CENTER   [id number] 06/07/20 Arrival Time: 1455  ASSESSMENT & PLAN:  1. Acute non-recurrent frontal sinusitis   2. Vasculitis (HCC)   3. Fever, unspecified fever cause   4. Elevated blood pressure reading without diagnosis of hypertension     Begin: Meds ordered this encounter  Medications  . amoxicillin-clavulanate (AUGMENTIN) 875-125 MG tablet    Sig: Take 1 tablet by mouth every 12 (twelve) hours.    Dispense:  20 tablet    Refill:  0   Labs pending. Unclear etiology of vasculitis. Observe closely.    Follow-up Information    Schedule an appointment as soon as possible for a visit  with Aliene Beams, MD.   Specialty: Family Medicine Contact information: [address] [place] [address] [phone]               Reviewed expectations re: course of current medical issues. Questions answered. Outlined signs and symptoms indicating need for more acute intervention. Patient verbalized understanding. After Visit Summary given.   SUBJECTIVE: History from: patient.  Laurie Ellison is a 49 y.o. female who presents with complaint of nasal congestion, post-nasal drainage, and sinus pain. Onset gradual, > 1 week ago. Respiratory symptoms: none. Fever: subj. Overall normal PO intake without n/v. OTC treatment: none reported. Seasonal allergies: mild to moderate. History of frequent sinus infections: "occasional". No specific aggravating or alleviating factors reported.  Social History   Tobacco Use  Smoking Status Never Smoker  Smokeless Tobacco Never Used   Also reports noting "rash" on bilateral LE this morning. Not painful. No itching. No LE edema.  Increased blood pressure noted today. Reports that she is not treated for HTN. She reports no chest pain on exertion, no dyspnea on exertion, no swelling of ankles, no orthostatic dizziness or lightheadedness, no orthopnea or paroxysmal nocturnal dyspnea, no palpitations and no intermittent  claudication symptoms.   OBJECTIVE:  Vitals:   06/07/20 1702  BP: (!) 189/106  Pulse: 76  Temp: 98.8 F (37.1 C)  TempSrc: Oral  SpO2: 100%     General appearance: alert; no distress HEENT: nasal congestion; clear runny nose; throat irritation secondary to post-nasal drainage; bilateral frontal tenderness to palpation; turbinates boggy Neck: supple without LAD; trachea midline CV: regular Lungs: unlabored respirations, symmetrical air entry; cough: absent; no respiratory distress Skin: warm and dry; vasculitis of bilateral LE; no LE edema Psychological: alert and cooperative; normal mood and affect  Allergies  Allergen Reactions  . Aspirin     Past Medical History:  Diagnosis Date  . Allergy   . Anxiety   . Asthma    Family History  Problem Relation Age of Onset  . Hypertension Mother   . Hypertension Father   . Hyperlipidemia Paternal Uncle   . Heart disease Paternal Uncle   . Stroke Paternal Uncle    Social History   Socioeconomic History  . Marital status: Single    Spouse name: Not on file  . Number of children: Not on file  . Years of education: Not on file  . Highest education level: Not on file  Occupational History  . Not on file  Tobacco Use  . Smoking status: Never Smoker  . Smokeless tobacco: Never Used  Substance and Sexual Activity  . Alcohol use: No  . Drug use: No  . Sexual activity: Not Currently  Other Topics Concern  . Not on file  Social History Narrative  . Not on file   Social Determinants of Health  Financial Resource Strain:   . Difficulty of Paying Living Expenses: Not on file  Food Insecurity:   . Worried About Programme researcher, broadcasting/film/video in the Last Year: Not on file  . Ran Out of Food in the Last Year: Not on file  Transportation Needs:   . Lack of Transportation (Medical): Not on file  . Lack of Transportation (Non-Medical): Not on file  Physical Activity:   . Days of Exercise per Week: Not on file  . Minutes of Exercise  per Session: Not on file  Stress:   . Feeling of Stress : Not on file  Social Connections:   . Frequency of Communication with Friends and Family: Not on file  . Frequency of Social Gatherings with Friends and Family: Not on file  . Attends Religious Services: Not on file  . Active Member of Clubs or Organizations: Not on file  . Attends Banker Meetings: Not on file  . Marital Status: Not on file  Intimate Partner Violence:   . Fear of Current or Ex-Partner: Not on file  . Emotionally Abused: Not on file  . Physically Abused: Not on file  . Sexually Abused: Not on file            Mardella Layman, MD 06/08/20 1000

## 2023-06-02 ENCOUNTER — Encounter (HOSPITAL_BASED_OUTPATIENT_CLINIC_OR_DEPARTMENT_OTHER): Payer: Self-pay | Admitting: Emergency Medicine

## 2023-06-02 ENCOUNTER — Other Ambulatory Visit: Payer: Self-pay

## 2023-06-02 ENCOUNTER — Emergency Department (HOSPITAL_BASED_OUTPATIENT_CLINIC_OR_DEPARTMENT_OTHER)
Admission: EM | Admit: 2023-06-02 | Discharge: 2023-06-02 | Disposition: A | Discharge status: Home / Self Care | Payer: Self-pay | Attending: Emergency Medicine | Admitting: Emergency Medicine

## 2023-06-02 DIAGNOSIS — J3489 Other specified disorders of nose and nasal sinuses: Secondary | ICD-10-CM

## 2023-06-02 DIAGNOSIS — Z20822 Contact with and (suspected) exposure to covid-19: Secondary | ICD-10-CM | POA: Insufficient documentation

## 2023-06-02 DIAGNOSIS — R0989 Other specified symptoms and signs involving the circulatory and respiratory systems: Secondary | ICD-10-CM | POA: Insufficient documentation

## 2023-06-02 LAB — RESP PANEL BY RT-PCR (RSV, FLU A&B, COVID)  RVPGX2
Influenza A by PCR: NEGATIVE
Influenza B by PCR: NEGATIVE
Resp Syncytial Virus by PCR: NEGATIVE
SARS Coronavirus 2 by RT PCR: NEGATIVE

## 2023-06-02 NOTE — ED Provider Notes (Signed)
  [place] EMERGENCY DEPARTMENT AT [facility] Provider Note   CSN: [id number] Arrival date & time: 06/02/23  1845     History {Add pertinent medical, surgical, social history, OB history to HPI:1} No chief complaint on file.   Laurie Ellison is a 52 y.o. female who presents with concern for COVID exposure.  States she has had a runny nose for the past couple days and wanted to be tested given a family member had a recently.  Denies any cough, shortness of breath, fever, nausea or vomiting.  HPI     Home Medications Prior to Admission medications   Medication Sig Start Date End Date Taking? Authorizing Provider  albuterol (PROVENTIL HFA;VENTOLIN HFA) 108 (90 Base) MCG/ACT inhaler Inhale into the lungs every 6 (six) hours as needed for wheezing or shortness of breath.    [provider]  amoxicillin-clavulanate (AUGMENTIN) 875-125 MG tablet Take 1 tablet by mouth every 12 (twelve) hours. 06/07/20   Mardella Layman, MD  cetirizine (ZYRTEC ALLERGY) 10 MG tablet Take 1 tablet (10 mg total) by mouth daily. 11/28/15   Everlene Farrier, PA-C      Allergies    Aspirin    Review of Systems   Review of Systems  Physical Exam Updated Vital Signs BP (!) 144/87   Pulse 70   Temp 98.5 F (36.9 C)   Resp 20   LMP 05/05/2023   SpO2 100%  Physical Exam  ED Results / Procedures / Treatments   Labs (all labs ordered are listed, but only abnormal results are displayed) Labs Reviewed  RESP PANEL BY RT-PCR (RSV, FLU A&B, COVID)  [id number]    EKG None  Radiology No results found.  Procedures Procedures  {Document cardiac monitor, telemetry assessment procedure when appropriate:1}  Medications Ordered in ED Medications - No data to display  ED Course/ Medical Decision Making/ A&P   {   Click here for ABCD2, HEART and other calculatorsREFRESH Note before signing :1}                              Medical Decision Making  ***  {Document critical care time when  appropriate:1} {Document review of labs and clinical decision tools ie heart score, Chads2Vasc2 etc:1}  {Document your independent review of radiology images, and any outside records:1} {Document your discussion with family members, caretakers, and with consultants:1} {Document social determinants of health affecting pt's care:1} {Document your decision making why or why not admission, treatments were needed:1} Final Clinical Impression(s) / ED Diagnoses Final diagnoses:  None    Rx / DC Orders ED Discharge Orders     None

## 2023-06-02 NOTE — Discharge Instructions (Signed)
Your COVID test was negative today.  You were also tested for flu and RSV which is negative.  Your runny nose may be allergic in nature.  You may take 10 mg cetirizine (Zyrtec) daily for this.  This is an over-the-counter medication you can get at any drugstore.   Return to the ER if you have any difficulty breathing, chest pain, any other new or concerning symptoms.

## 2023-06-02 NOTE — ED Triage Notes (Signed)
Exposed to Covid on Wednesday. Home test neg "I feel ok"but does reports some allergy symptoms, prior to known exposure

## 2023-06-27 DIAGNOSIS — N3000 Acute cystitis without hematuria: Secondary | ICD-10-CM | POA: Insufficient documentation

## 2023-06-27 NOTE — ED Triage Notes (Addendum)
Pt c/o urinary urgency, cloudiness to urine, and vaginal itching for a few weeks. Pt reports that she started amlodipine 86m ago. Pt reports she was told that amlodipine can mimic UTI

## 2023-06-28 ENCOUNTER — Encounter (HOSPITAL_BASED_OUTPATIENT_CLINIC_OR_DEPARTMENT_OTHER): Payer: Self-pay | Admitting: Emergency Medicine

## 2023-06-28 ENCOUNTER — Emergency Department (HOSPITAL_BASED_OUTPATIENT_CLINIC_OR_DEPARTMENT_OTHER)
Admission: EM | Admit: 2023-06-28 | Discharge: 2023-06-28 | Disposition: A | Discharge status: Home / Self Care | Payer: PRIVATE HEALTH INSURANCE | Attending: Emergency Medicine | Admitting: Emergency Medicine

## 2023-06-28 DIAGNOSIS — N3000 Acute cystitis without hematuria: Secondary | ICD-10-CM

## 2023-06-28 HISTORY — DX: Post-traumatic stress disorder, unspecified: F43.10

## 2023-06-28 HISTORY — DX: Depression, unspecified: F32.A

## 2023-06-28 LAB — URINALYSIS, ROUTINE W REFLEX MICROSCOPIC
Bilirubin Urine: NEGATIVE
Glucose, UA: NEGATIVE mg/dL
Hgb urine dipstick: NEGATIVE
Ketones, ur: NEGATIVE mg/dL
Nitrite: NEGATIVE
Protein, ur: 30 mg/dL — AB
Specific Gravity, Urine: 1.028 (ref 1.005–1.030)
WBC, UA: >50 WBC/hpf (ref 0–5)
pH: 5.5 (ref 5.0–8.0)

## 2023-06-28 LAB — PREGNANCY, URINE: Preg Test, Ur: NEGATIVE

## 2023-06-28 MED ORDER — CEPHALEXIN 250 MG PO CAPS
500.0000 mg | ORAL_CAPSULE | Freq: Once | ORAL | Status: AC
  Administered 2023-06-28: 500 mg via ORAL
  Filled 2023-06-28: qty 2

## 2023-06-28 MED ORDER — CEPHALEXIN 500 MG PO CAPS: 500.0000 mg | ORAL_CAPSULE | Freq: Two times a day (BID) | ORAL | 0 refills | Status: AC

## 2023-06-28 NOTE — ED Provider Notes (Signed)
   [place] EMERGENCY DEPARTMENT AT [facility]  Provider Note  CSN: [id number] Arrival date & time: 06/27/23 2248  History Chief Complaint  Patient presents with   Dysuria    Laurie Ellison is a 52 y.o. female reports 2 weeks of dysuria and urinary frequency. She called her doctor and was told it was 'a side effect of amlodipine'. She has not had vaginal discharge but some itching. No fever, flank pain or vomiting.    Home Medications Prior to Admission medications   Medication Sig Start Date End Date Taking? Authorizing Provider  cephALEXin (KEFLEX) 500 MG capsule Take 1 capsule (500 mg total) by mouth 2 (two) times daily for 7 days. 06/28/23 07/05/23 Yes Pollyann Savoy, MD  albuterol (PROVENTIL HFA;VENTOLIN HFA) 108 [id number] Base) MCG/ACT inhaler Inhale into the lungs every 6 (six) hours as needed for wheezing or shortness of breath.    [provider]  cetirizine (ZYRTEC ALLERGY) 10 MG tablet Take 1 tablet (10 mg total) by mouth daily. 11/28/15   Everlene Farrier, PA-C     Allergies    Aspirin   Review of Systems   Review of Systems Please see HPI for pertinent positives and negatives  Physical Exam BP (!) 172/123 (BP Location: Right Wrist)   Pulse 81   Temp 98.1 F (36.7 C) (Oral)   Resp 18   LMP 05/05/2023   SpO2 100%   Physical Exam Vitals and nursing note reviewed.  HENT:     Head: Normocephalic.     Nose: Nose normal.  Eyes:     Extraocular Movements: Extraocular movements intact.  Pulmonary:     Effort: Pulmonary effort is normal.  Musculoskeletal:        General: Normal range of motion.     Cervical back: Neck supple.  Skin:    Findings: No rash (on exposed skin).  Neurological:     Mental Status: She is alert and oriented to person, place, and time.  Psychiatric:        Mood and Affect: Mood normal.     ED Results / Procedures / Treatments   EKG None  Procedures Procedures  Medications Ordered in the ED Medications   cephALEXin (KEFLEX) capsule 500 mg (has no administration in time range)    Initial Impression and Plan  Patient here with dysuria, UA consistent with UTI. Offered STI screening but she declines today. Has PCP follow up next week. Start Keflex. RTED for any other concerns.   ED Course       MDM Rules/Calculators/A&P Medical Decision Making Problems Addressed: Acute cystitis without hematuria: acute illness or injury  Amount and/or Complexity of Data Reviewed Labs: ordered. Decision-making details documented in ED Course.  Risk Prescription drug management.     Final Clinical Impression(s) / ED Diagnoses Final diagnoses:  Acute cystitis without hematuria    Rx / DC Orders ED Discharge Orders          Ordered    cephALEXin (KEFLEX) 500 MG capsule  2 times daily        06/28/23 0353             Pollyann Savoy, MD 06/28/23 [phone]
